# Patient Record
Sex: Male | Born: 1990 | Race: Black or African American | Hispanic: No | Marital: Single
Health system: Southern US, Community
[De-identification: ages and names within clinical notes are randomized; demographics above are authoritative.]

---
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
## 2015-02-03 ENCOUNTER — Other Ambulatory Visit (HOSPITAL_COMMUNITY): Payer: Self-pay

## 2015-02-03 ENCOUNTER — Encounter (HOSPITAL_COMMUNITY): Payer: Self-pay | Admitting: Emergency Medicine

## 2015-02-03 ENCOUNTER — Emergency Department (HOSPITAL_COMMUNITY): Payer: Self-pay

## 2015-02-03 ENCOUNTER — Inpatient Hospital Stay (HOSPITAL_COMMUNITY)
Admission: EM | Admit: 2015-02-03 | Discharge: 2015-02-03 | DRG: 948 | Discharge status: Against Medical Advice | Payer: Self-pay | Attending: Internal Medicine | Admitting: Internal Medicine

## 2015-02-03 DIAGNOSIS — J45909 Unspecified asthma, uncomplicated: Secondary | ICD-10-CM | POA: Diagnosis present

## 2015-02-03 DIAGNOSIS — R4182 Altered mental status, unspecified: Principal | ICD-10-CM | POA: Diagnosis present

## 2015-02-03 DIAGNOSIS — R404 Transient alteration of awareness: Secondary | ICD-10-CM

## 2015-02-03 DIAGNOSIS — J9690 Respiratory failure, unspecified, unspecified whether with hypoxia or hypercapnia: Secondary | ICD-10-CM

## 2015-02-03 DIAGNOSIS — E876 Hypokalemia: Secondary | ICD-10-CM | POA: Diagnosis present

## 2015-02-03 DIAGNOSIS — F10929 Alcohol use, unspecified with intoxication, unspecified: Secondary | ICD-10-CM | POA: Insufficient documentation

## 2015-02-03 DIAGNOSIS — F10129 Alcohol abuse with intoxication, unspecified: Secondary | ICD-10-CM | POA: Diagnosis present

## 2015-02-03 LAB — URINE MICROSCOPIC-ADD ON

## 2015-02-03 LAB — URINALYSIS, ROUTINE W REFLEX MICROSCOPIC
Bilirubin Urine: NEGATIVE
GLUCOSE, UA: NEGATIVE mg/dL
KETONES UR: NEGATIVE mg/dL
Leukocytes, UA: NEGATIVE
Nitrite: NEGATIVE
PH: 6 (ref 5.0–8.0)
Specific Gravity, Urine: 1.025 (ref 1.005–1.030)
Urobilinogen, UA: 0.2 mg/dL (ref 0.0–1.0)

## 2015-02-03 LAB — CBC
HCT: 39.7 % (ref 39.0–52.0)
Hemoglobin: 13.3 g/dL (ref 13.0–17.0)
MCH: 30.2 pg (ref 26.0–34.0)
MCHC: 33.5 g/dL (ref 30.0–36.0)
MCV: 90.2 fL (ref 78.0–100.0)
PLATELETS: 213 10*3/uL (ref 150–400)
RBC: 4.4 MIL/uL (ref 4.22–5.81)
RDW: 13.5 % (ref 11.5–15.5)
WBC: 7.1 10*3/uL (ref 4.0–10.5)

## 2015-02-03 LAB — BLOOD GAS, ARTERIAL
Acid-base deficit: 5.9 mmol/L — ABNORMAL HIGH (ref 0.0–2.0)
Bicarbonate: 18.3 mEq/L — ABNORMAL LOW (ref 20.0–24.0)
Drawn by: 382351
FIO2: 0.4 %
MECHVT: 500 mL
O2 Saturation: 99.1 %
PEEP: 5 cmH2O
PH ART: 7.38 (ref 7.350–7.450)
Patient temperature: 37
RATE: 14 resp/min
TCO2: 16.2 mmol/L (ref 0–100)
pCO2 arterial: 31.5 mmHg — ABNORMAL LOW (ref 35.0–45.0)
pO2, Arterial: 195 mmHg — ABNORMAL HIGH (ref 80.0–100.0)

## 2015-02-03 LAB — COMPREHENSIVE METABOLIC PANEL
ALBUMIN: 4.2 g/dL (ref 3.5–5.2)
ALK PHOS: 47 U/L (ref 39–117)
ALT: 14 U/L (ref 0–53)
AST: 21 U/L (ref 0–37)
Anion gap: 12 (ref 5–15)
BILIRUBIN TOTAL: 0.5 mg/dL (ref 0.3–1.2)
BUN: 13 mg/dL (ref 6–23)
CALCIUM: 8.4 mg/dL (ref 8.4–10.5)
CO2: 20 mmol/L (ref 19–32)
Chloride: 105 mmol/L (ref 96–112)
Creatinine, Ser: 1 mg/dL (ref 0.50–1.35)
GFR calc Af Amer: >90 mL/min (ref >90)
GFR calc non Af Amer: >90 mL/min (ref >90)
Glucose, Bld: 98 mg/dL (ref 70–99)
Potassium: 3.4 mmol/L — ABNORMAL LOW (ref 3.5–5.1)
SODIUM: 137 mmol/L (ref 135–145)
TOTAL PROTEIN: 7.5 g/dL (ref 6.0–8.3)

## 2015-02-03 LAB — CBC WITH DIFFERENTIAL/PLATELET
BASOS PCT: 1 % (ref 0–1)
Basophils Absolute: 0.1 10*3/uL (ref 0.0–0.1)
EOS ABS: 0.6 10*3/uL (ref 0.0–0.7)
Eosinophils Relative: 7 % — ABNORMAL HIGH (ref 0–5)
HCT: 41.8 % (ref 39.0–52.0)
HEMOGLOBIN: 14.2 g/dL (ref 13.0–17.0)
Lymphocytes Relative: 46 % (ref 12–46)
Lymphs Abs: 3.9 10*3/uL (ref 0.7–4.0)
MCH: 30.9 pg (ref 26.0–34.0)
MCHC: 34 g/dL (ref 30.0–36.0)
MCV: 91.1 fL (ref 78.0–100.0)
Monocytes Absolute: 0.6 10*3/uL (ref 0.1–1.0)
Monocytes Relative: 8 % (ref 3–12)
NEUTROS PCT: 38 % — AB (ref 43–77)
Neutro Abs: 3.3 10*3/uL (ref 1.7–7.7)
Platelets: 216 10*3/uL (ref 150–400)
RBC: 4.59 MIL/uL (ref 4.22–5.81)
RDW: 13.7 % (ref 11.5–15.5)
WBC: 8.5 10*3/uL (ref 4.0–10.5)

## 2015-02-03 LAB — RAPID URINE DRUG SCREEN, HOSP PERFORMED
AMPHETAMINES: NOT DETECTED
BARBITURATES: NOT DETECTED
Benzodiazepines: NOT DETECTED
Cocaine: NOT DETECTED
Opiates: NOT DETECTED
Tetrahydrocannabinol: POSITIVE — AB

## 2015-02-03 LAB — CK
CK TOTAL: 611 U/L — AB (ref 7–232)
CK TOTAL: 788 U/L — AB (ref 7–232)

## 2015-02-03 LAB — ETHANOL: Alcohol, Ethyl (B): 99 mg/dL — ABNORMAL HIGH (ref 0–9)

## 2015-02-03 LAB — MRSA PCR SCREENING: MRSA by PCR: NEGATIVE

## 2015-02-03 MED ORDER — ROCURONIUM BROMIDE 50 MG/5ML IV SOLN
INTRAVENOUS | Status: AC
  Filled 2015-02-03: qty 2

## 2015-02-03 MED ORDER — ENOXAPARIN SODIUM 40 MG/0.4ML ~~LOC~~ SOLN
40.0000 mg | SUBCUTANEOUS | Status: DC
  Administered 2015-02-03: 40 mg via SUBCUTANEOUS
  Filled 2015-02-03: qty 0.4

## 2015-02-03 MED ORDER — LIDOCAINE HCL (CARDIAC) 20 MG/ML IV SOLN
INTRAVENOUS | Status: AC
  Filled 2015-02-03: qty 5

## 2015-02-03 MED ORDER — PROPOFOL 10 MG/ML IV EMUL
INTRAVENOUS | Status: AC
  Filled 2015-02-03: qty 100

## 2015-02-03 MED ORDER — SUCCINYLCHOLINE CHLORIDE 20 MG/ML IJ SOLN
150.0000 mg | Freq: Once | INTRAMUSCULAR | Status: AC
  Administered 2015-02-03: 150 mg via INTRAVENOUS

## 2015-02-03 MED ORDER — FENTANYL CITRATE 0.05 MG/ML IJ SOLN
INTRAMUSCULAR | Status: AC
  Administered 2015-02-03: 50 ug
  Filled 2015-02-03: qty 2

## 2015-02-03 MED ORDER — CHLORHEXIDINE GLUCONATE 0.12 % MT SOLN
15.0000 mL | Freq: Two times a day (BID) | OROMUCOSAL | Status: DC
  Administered 2015-02-03: 15 mL via OROMUCOSAL
  Filled 2015-02-03: qty 15

## 2015-02-03 MED ORDER — ONDANSETRON HCL 4 MG PO TABS: 4.0000 mg | ORAL_TABLET | Freq: Four times a day (QID) | ORAL | Status: DC | PRN

## 2015-02-03 MED ORDER — ROCURONIUM BROMIDE 50 MG/5ML IV SOLN
50.0000 mg | Freq: Once | INTRAVENOUS | Status: AC
  Administered 2015-02-03: 50 mg via INTRAVENOUS

## 2015-02-03 MED ORDER — MIDAZOLAM HCL 2 MG/2ML IJ SOLN
INTRAMUSCULAR | Status: AC
  Filled 2015-02-03: qty 2

## 2015-02-03 MED ORDER — FENTANYL CITRATE 0.05 MG/ML IJ SOLN
INTRAMUSCULAR | Status: AC
  Filled 2015-02-03: qty 2

## 2015-02-03 MED ORDER — SUCCINYLCHOLINE CHLORIDE 20 MG/ML IJ SOLN
INTRAMUSCULAR | Status: AC
  Filled 2015-02-03: qty 1

## 2015-02-03 MED ORDER — PROPOFOL 10 MG/ML IV EMUL
5.0000 ug/kg/min | INTRAVENOUS | Status: DC
  Administered 2015-02-03: 5 ug/kg/min via INTRAVENOUS
  Administered 2015-02-03: 60 ug/kg/min via INTRAVENOUS
  Filled 2015-02-03: qty 200

## 2015-02-03 MED ORDER — ALBUTEROL SULFATE (2.5 MG/3ML) 0.083% IN NEBU
2.5000 mg | INHALATION_SOLUTION | Freq: Four times a day (QID) | RESPIRATORY_TRACT | Status: DC
  Administered 2015-02-03: 2.5 mg via RESPIRATORY_TRACT
  Filled 2015-02-03 (×2): qty 3

## 2015-02-03 MED ORDER — FENTANYL BOLUS VIA INFUSION
50.0000 ug | INTRAVENOUS | Status: DC | PRN
  Filled 2015-02-03: qty 50

## 2015-02-03 MED ORDER — ONDANSETRON HCL 4 MG/2ML IJ SOLN: 4.0000 mg | Freq: Four times a day (QID) | INTRAMUSCULAR | Status: DC | PRN

## 2015-02-03 MED ORDER — ETOMIDATE 2 MG/ML IV SOLN
INTRAVENOUS | Status: AC
  Filled 2015-02-03: qty 20

## 2015-02-03 MED ORDER — POTASSIUM CHLORIDE 10 MEQ/100ML IV SOLN
10.0000 meq | INTRAVENOUS | Status: AC
  Administered 2015-02-03 (×5): 10 meq via INTRAVENOUS
  Filled 2015-02-03 (×4): qty 100

## 2015-02-03 MED ORDER — ACETAMINOPHEN 650 MG RE SUPP: 650.0000 mg | Freq: Four times a day (QID) | RECTAL | Status: DC | PRN

## 2015-02-03 MED ORDER — MIDAZOLAM HCL 2 MG/2ML IJ SOLN
2.0000 mg | Freq: Once | INTRAMUSCULAR | Status: AC
  Administered 2015-02-03: 2 mg via INTRAVENOUS
  Filled 2015-02-03: qty 2

## 2015-02-03 MED ORDER — SODIUM CHLORIDE 0.9 % IV SOLN
INTRAVENOUS | Status: DC
  Administered 2015-02-03: 07:00:00 via INTRAVENOUS

## 2015-02-03 MED ORDER — OLANZAPINE 10 MG IM SOLR
5.0000 mg | Freq: Once | INTRAMUSCULAR | Status: DC
  Filled 2015-02-03: qty 10

## 2015-02-03 MED ORDER — FENTANYL CITRATE 0.05 MG/ML IJ SOLN
50.0000 ug | Freq: Once | INTRAMUSCULAR | Status: AC
  Administered 2015-02-03: 50 ug via INTRAVENOUS

## 2015-02-03 MED ORDER — ETOMIDATE 2 MG/ML IV SOLN
20.0000 mg | Freq: Once | INTRAVENOUS | Status: AC
  Administered 2015-02-03: 20 mg via INTRAVENOUS

## 2015-02-03 MED ORDER — SODIUM CHLORIDE 0.9 % IV SOLN
25.0000 ug/h | INTRAVENOUS | Status: DC
  Administered 2015-02-03: 50 ug/h via INTRAVENOUS
  Filled 2015-02-03: qty 50

## 2015-02-03 MED ORDER — ACETAMINOPHEN 325 MG PO TABS
650.0000 mg | ORAL_TABLET | Freq: Four times a day (QID) | ORAL | Status: DC | PRN
Start: 2015-02-03 — End: 2015-02-03

## 2015-02-03 MED ORDER — CETYLPYRIDINIUM CHLORIDE 0.05 % MT LIQD: 7.0000 mL | Freq: Four times a day (QID) | OROMUCOSAL | Status: DC

## 2015-02-03 MED ORDER — PANTOPRAZOLE SODIUM 40 MG IV SOLR
40.0000 mg | Freq: Every day | INTRAVENOUS | Status: DC
Start: 2015-02-03 — End: 2015-02-03
  Administered 2015-02-03: 40 mg via INTRAVENOUS
  Filled 2015-02-03: qty 40

## 2015-02-03 NOTE — Procedures (Signed)
Extubation Procedure Note  Patient Details:   Name: Ree ShayJevon E Baldus DOB: [birth year] MRN: [id number]   Airway Documentation:  Airway 7.5 mm (Active)  Secured at (cm) 25 cm 02/03/2015 12:03 PM  Measured From Teeth 02/03/2015 12:03 PM  Secured Location Right 02/03/2015 12:03 PM  Secured By [employer] 02/03/2015 12:03 PM  Cuff Pressure (cm H2O) 28 cm H2O 02/03/2015  8:26 AM  Site Condition Dry 02/03/2015 12:03 PM    MD order to extubate as soon as patient woke up.  Patient extubated by RT.  Patient extubated to 2 LPM [place], saturation 100%.  Will continue to monitor.   Evaluation  O2 sats: stable throughout Complications: No apparent complications Patient did tolerate procedure well. Bilateral Breath Sounds: Clear   Yes  Noreene FilbertLasley, Yanisa Goodgame Billingsley 02/03/2015, 2:08 PM

## 2015-02-03 NOTE — ED Provider Notes (Signed)
CSN: [id number]     Arrival date & time 02/03/15  0325 History   First MD Initiated Contact with Patient 02/03/15 [phone]     Chief Complaint  Patient presents with  . Altered Mental Status     (Consider location/radiation/quality/duration/timing/severity/associated sxs/prior Treatment) HPI Comments: Patient is a 24 year old male with no significant past medical history. He was brought by his brother for evaluation of mental status change. They were at a club earlier this evening. When they left and were driving home, the patient became suddenly confused and began shaking. He was then brought here for evaluation. Patient adds no additional history due to his mental status.  His brother does report that he was drinking alcohol this evening and he was celebrating his birthday. He is unsure of any drug use but states that his brother has used marijuana in the past.  Patient is a 24 y.o. male presenting with altered mental status. The history is provided by the patient.  Altered Mental Status Presenting symptoms: combativeness, confusion and disorientation   Severity:  Severe Most recent episode:  Today Episode history:  Single Timing:  Constant Progression:  Worsening Chronicity:  New Context: alcohol use     History reviewed. No pertinent past medical history. History reviewed. No pertinent past surgical history. History reviewed. No pertinent family history. History  Substance Use Topics  . Smoking status: Unknown If Ever Smoked  . Smokeless tobacco: Not on file  . Alcohol Use: Yes    Review of Systems  Psychiatric/Behavioral: Positive for confusion.  All other systems reviewed and are negative.     Allergies  Review of patient's allergies indicates no known allergies.  Home Medications   Prior to Admission medications   Not on File   BP 103/53 mmHg  Pulse 88  Resp 14  Wt 180 lb (81.647 kg)  SpO2 100% Physical Exam  Constitutional: He appears well-developed and  well-nourished.  Patient arrives here extremely agitated, writhing, with snoring respirations. He is vomiting.  HENT:  Head: Normocephalic and atraumatic.  Eyes: Pupils are equal, round, and reactive to light.  Neck: Normal range of motion. Neck supple.  Musculoskeletal: Normal range of motion. He exhibits no edema.  Neurological:  Neurologic exam is impossible due to the patient's combativeness and mental status. He does move all extremities.  Nursing note and vitals reviewed.   ED Course  Procedures (including critical care time) Labs Review Labs Reviewed  CBC WITH DIFFERENTIAL/PLATELET - Abnormal; Notable for the following:    Neutrophils Relative % 38 (*)    Eosinophils Relative 7 (*)    All other components within normal limits  BLOOD GAS, ARTERIAL - Abnormal; Notable for the following:    pCO2 arterial 31.5 (*)    pO2, Arterial 195.0 (*)    Bicarbonate 18.3 (*)    Acid-base deficit 5.9 (*)    All other components within normal limits  COMPREHENSIVE METABOLIC PANEL  ETHANOL  URINE RAPID DRUG SCREEN (HOSP PERFORMED)  URINALYSIS, ROUTINE W REFLEX MICROSCOPIC    Imaging Review No results found.    MDM   Final diagnoses:  None    Patient is a 24 year old male brought to the emergency department by friends for evaluation of mental status change. He he was drinking with friends at a club celebrating his birthday this evening. On his way home he apparently became nauseated and vomited. Shortly thereafter he became violent and incoherent. He was then brought here by his brother and friends for evaluation of this.  He was initially violent and uncooperative. These episodes rotated between this and episodes of decreased responsiveness with snoring respirations. When he was stimulated he would again become violent and require physical restraint. For this reason the decision was made to proceed with RSI for his protection and the protection of his care providers, and to facilitate  his workup.  RSI was performed using 20 mg of etomidate followed by 150 mg of succinylcholine. A 7.5 endotracheal tube was easily placed using a #3 McIntosh blade. Placement was confirmed with direct visualization, end-tidal CO2, and auscultation over the stomach and both lung fields.  Laboratory studies were obtained which were essentially unremarkable. His blood alcohol was 99 and urine tox screen revealed only marijuana. His presentation is consistent with ingestion of some sort of synthetic drug, whether it be ectasy, GHB, spice, molly, or some other substance. I've spoken with poison control who have advised to watch for rhabdomyolysis. I've also spoken with Dr. Sharl Ma from the hospitalist service who agrees to admit.  CRITICAL CARE Performed by: Geoffery Lyons Total critical care time: 70 minutes Critical care time was exclusive of separately billable procedures and treating other patients. Critical care was necessary to treat or prevent imminent or life-threatening deterioration. Critical care was time spent personally by me on the following activities: development of treatment plan with patient and/or surrogate as well as nursing, discussions with consultants, evaluation of patient's response to treatment, examination of patient, obtaining history from patient or surrogate, ordering and performing treatments and interventions, ordering and review of laboratory studies, ordering and review of radiographic studies, pulse oximetry and re-evaluation of patient's condition.     Geoffery Lyons, MD 02/03/15 [phone]

## 2015-02-03 NOTE — ED Notes (Signed)
Patient resting more comfortably after Propofol increased to 60 mcg/kg/min and Versed given.

## 2015-02-03 NOTE — Consult Note (Signed)
Consult requested by: [facility]  Consult requested for ventilator:  HPI: This is a 24 year old who was brought to the emergency room with altered mental status. The history is obtained from the medical record. He apparently had been at a nightclub with his brother and they were driving home when the patient became confused and began shaking. It's not clear if there was an actual seizure. He had been drinking alcohol and is not clear if he had any other drugs. His urine drug screen was positive for marijuana. He became agitated and uncooperative had to be restrained and eventually had to be intubated for airway protection. He is now sedated and intubated and on mechanical ventilation  History reviewed. No pertinent past medical history.   History reviewed. No pertinent family history.   History   Social History  . Marital Status: Single    Spouse Name: N/A  . Number of Children: N/A  . Years of Education: N/A   Social History Main Topics  . Smoking status: Unknown If Ever Smoked  . Smokeless tobacco: Not on file  . Alcohol Use: Yes  . Drug Use: Not on file  . Sexual Activity: Not on file   Other Topics Concern  . None   Social History Narrative  . None     ROS: Not obtainable    Objective: Vital signs in last 24 hours: Pulse Rate:  [53-114] 53 (03/11 0600) Resp:  [12-20] 14 (03/11 0600) BP: (96-212)/(49-116) 97/57 mmHg (03/11 0600) SpO2:  [99 %-100 %] 100 % (03/11 0600) FiO2 (%):  [40 %] 40 % (03/11 0346) Weight:  [81.647 kg (180 lb)] 81.647 kg (180 lb) (03/11 0347) Weight change:     Intake/Output from previous day:    PHYSICAL EXAM He is intubated and sedated. He is on mechanical ventilation. His pupils react nose and throat are clear mucous membranes are moist his neck is supple. His chest shows rhonchi his heart is regular without gallop. His abdomen is soft. Extremities showed no edema. He has multiple tattoos. Central nervous system examination not  really obtainable but He had been moving all 4 extremities.   Lab Results: Basic Metabolic Panel:  Recent Labs  60/45/40 0345  NA 137  K 3.4*  CL 105  CO2 20  GLUCOSE 98  BUN 13  CREATININE 1.00  CALCIUM 8.4   Liver Function Tests:  Recent Labs  02/03/15 0345  AST 21  ALT 14  ALKPHOS 47  BILITOT 0.5  PROT 7.5  ALBUMIN 4.2   No results for input(s): LIPASE, AMYLASE in the last 72 hours. No results for input(s): AMMONIA in the last 72 hours. CBC:  Recent Labs  02/03/15 0345  WBC 8.5  NEUTROABS 3.3  HGB 14.2  HCT 41.8  MCV 91.1  PLT 216   Cardiac Enzymes: No results for input(s): CKTOTAL, CKMB, CKMBINDEX, TROPONINI in the last 72 hours. BNP: No results for input(s): PROBNP in the last 72 hours. D-Dimer: No results for input(s): DDIMER in the last 72 hours. CBG: No results for input(s): GLUCAP in the last 72 hours. Hemoglobin A1C: No results for input(s): HGBA1C in the last 72 hours. Fasting Lipid Panel: No results for input(s): CHOL, HDL, LDLCALC, TRIG, CHOLHDL, LDLDIRECT in the last 72 hours. Thyroid Function Tests: No results for input(s): TSH, T4TOTAL, FREET4, T3FREE, THYROIDAB in the last 72 hours. Anemia Panel: No results for input(s): VITAMINB12, FOLATE, FERRITIN, TIBC, IRON, RETICCTPCT in the last 72 hours. Coagulation: No results for input(s): LABPROT, INR  in the last 72 hours. Urine Drug Screen: Drugs of Abuse     Component Value Date/Time   LABOPIA NONE DETECTED 02/03/2015 0405   COCAINSCRNUR NONE DETECTED 02/03/2015 0405   LABBENZ NONE DETECTED 02/03/2015 0405   AMPHETMU NONE DETECTED 02/03/2015 0405   THCU POSITIVE* 02/03/2015 0405   LABBARB NONE DETECTED 02/03/2015 0405    Alcohol Level:  Recent Labs  02/03/15 0345  ETH 99*   Urinalysis:  Recent Labs  02/03/15 0405  COLORURINE YELLOW  LABSPEC 1.025  PHURINE 6.0  GLUCOSEU NEGATIVE  HGBUR TRACE*  BILIRUBINUR NEGATIVE  KETONESUR NEGATIVE  PROTEINUR TRACE*  UROBILINOGEN  0.2  NITRITE NEGATIVE  LEUKOCYTESUR NEGATIVE   Misc. Labs:   ABGS:  Recent Labs  02/03/15 0347  PHART 7.380  PO2ART 195.0*  TCO2 16.2  HCO3 18.3*     MICROBIOLOGY: No results found for this or any previous visit (from the past 240 hour(s)).  Studies/Results: Ct Head Wo Contrast  02/03/2015   CLINICAL DATA:  Initial evaluation for acute altered mental status, unresponsive.  EXAM: CT HEAD WITHOUT CONTRAST  TECHNIQUE: Contiguous axial images were obtained from the base of the skull through the vertex without intravenous contrast.  COMPARISON:  None.  FINDINGS: There is no acute intracranial hemorrhage or infarct. No mass lesion or midline shift. Gray-white matter differentiation is well maintained. Ventricles are normal in size without evidence of hydrocephalus. CSF containing spaces are within normal limits. No extra-axial fluid collection.  The calvarium is intact.  Orbital soft tissues are within normal limits.  Partially visualized nasal cavities are opacified. There is scattered mucoperiosteal thickening within the ethmoidal air cells. Paranasal sinuses are otherwise clear. No mastoid effusion.  Scalp soft tissues are unremarkable.  IMPRESSION: Normal head CT with no acute intracranial abnormality identified.   Electronically Signed   By: Rise Mu M.D.   On: 02/03/2015 04:39   Dg Chest Portable 1 View  02/03/2015   CLINICAL DATA:  Altered level of consciousness. Endotracheal tube placement.  EXAM: PORTABLE CHEST - 1 VIEW  COMPARISON:  None.  FINDINGS: The endotracheal tube is 5.8 cm from the carina. Tip and side port of the enteric tube below the diaphragm in the stomach. Low lung volumes. Cardiomediastinal contours are normal. No consolidation, pleural effusion or pneumothorax. No osseous abnormalities.  IMPRESSION: Endotracheal and enteric tubes in place.  Clear lungs.   Electronically Signed   By: Rubye Oaks M.D.   On: 02/03/2015 04:44    Medications:  Prior to  Admission:  No prescriptions prior to admission   Scheduled: . lidocaine (cardiac) 100 mg/72ml       Continuous: . propofol 60 mcg/kg/min (02/03/15 0439)   PRN:  Assesment: He has altered mental status and was intubated mostly for airway protection. He does have a history of asthma. He is on propofol. He apparently has a history of asthma. Chest x-ray is clear. Principal Problem:   Altered mental state Active Problems:   Altered level of consciousness    Plan: Since he has a history of asthma would be reasonable to have him on nebulizer treatments because bronchospasm may cause more trouble with trying to get him off of the ventilator. I don't think it's quite clear why he has the altered mental status and I'm not sure if the drug screen would pick up other street drugs. It does not appear that he aspirated. Once his mental status has improved weaning can be attempted. I will be out of town starting about  noon today and will return on Monday, 02/06/2015    LOS: 0 days   Kyera Felan L 02/03/2015, 7:18 AM

## 2015-02-03 NOTE — H&P (Signed)
PCP:   No primary care provider on file.   Chief Complaint:  Altered mental status  HPI: 24 year old male with history of asthma who was brought to the ED by his brother for evaluation of AMS. Patient and his brother were at a club earlier this evening and then they were driving home patient suddenly became confused and began shaking. Patient was found to be in or mental status. Patient was drinking alcohol as per the brother, not sure whether patient used any drugs. In the ED patient became uncooperative and agitated and violent requiring physical restraint. Later patient was intubated. Urine toxicology screen showed marijuana. Poison control was contacted by Dr. Judd Lienelo and they recommend to watch for rhabdomyolysis.  Allergies:  No Known Allergies   History reviewed. No pertinent past medical history.  History reviewed. No pertinent past surgical history.  Prior to Admission medications   Not on File    Social History:  reports that he drinks alcohol. His tobacco and drug histories are not on file.   family history- noncontributory  All the positives are listed in [facility]  Review of Systems:  Unobtainable at this time   Physical Exam: Blood pressure 98/53, pulse 62, resp. rate 16, weight 81.647 kg (180 lb), SpO2 99 %. Constitutional:   Patient is a well-developed and well-nourished male who is currently intubated. Head: Normocephalic and atraumatic Mouth: Endotracheal tube in place Eyes: PERRL, EOMI, conjunctivae normal Neck: Supple, No Thyromegaly Cardiovascular: RRR, S1 normal, S2 normal Pulmonary/Chest: CTAB, no wheezes, rales, or rhonchi Abdominal: Soft. Non-tender, non-distended, bowel sounds are normal, no masses, organomegaly, or guarding present.  Neurological: Sedated Extremities : No Cyanosis, Clubbing or Edema  Labs on Admission:  Basic Metabolic Panel:  Recent Labs Lab 02/03/15 0345  NA 137  K 3.4*  CL 105  CO2 20  GLUCOSE 98  BUN 13  CREATININE  1.00  CALCIUM 8.4   Liver Function Tests:  Recent Labs Lab 02/03/15 0345  AST 21  ALT 14  ALKPHOS 47  BILITOT 0.5  PROT 7.5  ALBUMIN 4.2   No results for input(s): LIPASE, AMYLASE in the last 168 hours. No results for input(s): AMMONIA in the last 168 hours. CBC:  Recent Labs Lab 02/03/15 0345  WBC 8.5  NEUTROABS 3.3  HGB 14.2  HCT 41.8  MCV 91.1  PLT 216   Radiological Exams on Admission: Ct Head Wo Contrast  02/03/2015   CLINICAL DATA:  Initial evaluation for acute altered mental status, unresponsive.  EXAM: CT HEAD WITHOUT CONTRAST  TECHNIQUE: Contiguous axial images were obtained from the base of the skull through the vertex without intravenous contrast.  COMPARISON:  None.  FINDINGS: There is no acute intracranial hemorrhage or infarct. No mass lesion or midline shift. Gray-white matter differentiation is well maintained. Ventricles are normal in size without evidence of hydrocephalus. CSF containing spaces are within normal limits. No extra-axial fluid collection.  The calvarium is intact.  Orbital soft tissues are within normal limits.  Partially visualized nasal cavities are opacified. There is scattered mucoperiosteal thickening within the ethmoidal air cells. Paranasal sinuses are otherwise clear. No mastoid effusion.  Scalp soft tissues are unremarkable.  IMPRESSION: Normal head CT with no acute intracranial abnormality identified.   Electronically Signed   By: Rise MuBenjamin  McClintock M.D.   On: 02/03/2015 04:39     Assessment/Plan Principal Problem:   Altered mental state Active Problems:   Altered level of consciousness  Altered mental status Patient presenting with altered mental status,  likely from drug overdose. Urine drug screen only shows marijuana. Poison control was contacted by the ED physician and they recommended to check for rhabdomyolysis. Will obtain CK. Patient is currently intubated and sedated with propofol. We'll also obtain EKG. Will continue IV  fluids   Code status: Full code  Family discussion: Admission, patients condition and plan of care including tests being ordered have been discussed with the patient and *his mother at bedside* who indicate understanding and agree with the plan and Code Status.   Time Spent on Admission: 45 minutes  Kamera Dubas S [facility] Pager: [phone] 02/03/2015, 5:58 AM  If 7PM-7AM, please contact night-coverage  [email or web address]  Password [email or web address]

## 2015-02-03 NOTE — ED Notes (Signed)
Patient starting to move arms and legs.  Order received for Versed.

## 2015-02-03 NOTE — ED Notes (Signed)
Dr Lama in to assess for admission 

## 2015-02-03 NOTE — ED Notes (Signed)
Patient brought to ED by friends. On arrival to ED patient combative and obtunded with snoring respirations. Patient vomited on self prior to arrival. Friends report patient has been drinking tonight, denies drug use.

## 2015-02-03 NOTE — Progress Notes (Addendum)
Patient was admitted to the hospital earlier this morning by Dr. Sharl MaLama  Patient seen and examined.  He was admitted to the hospital with alteration in mental status. Patient is sedated, intubated at this time and cannot provide any history. Review of records indicate that he was at a club early last night. When he was returning home with his brother, he suddenly became confused and "began shaking". When he was brought to the emergency room patient was very agitated, combative and violent. He was very disoriented and confused. Alcohol level was noted to be mildly elevated. Urine drug screen positive for cannabis. Due to his severe disorientation and combativeness, patient required intubation for adequate sedation. He was seen by pulmonology today. Lungs appear clear without any wheezing. CT head was unremarkable. Urinalysis did not show any acute findings. It is possible the patient may have ingested some substance that is not detected on urine drug screen. We will continue with IV fluids and monitor clinical status. Will attempt wake up assessment in the morning.  Glenn Harris   Addendum 12:00:  Was called by staff to assess the patient for increasing agitation. Patient is on max dose of propofol and still very agitated requiring at least 3 staff members to help subdue him. Given additional sedation. Case was discussed with Dr. Molli KnockYacoub on [facility]. Recommendations were to discontinue sedation and extubate the patient. If the patient fails extubation, he recommended reintubating the patient and [facility]. Will alert anesthesia as well as security at the time of extubation.  Glenn Harris

## 2015-02-03 NOTE — Care Management Utilization Note (Signed)
UR completed 

## 2015-02-03 NOTE — Discharge Summary (Addendum)
Physician Discharge Summary  Glenn Harris WJX:[id number] DOB: 08/05/1991 DOA: 02/03/2015  PCP: No primary care provider on file.  Admit date: 02/03/2015 Discharge date: 02/03/2015  Time spent: 60 minutes  1. PATIENT LEFT THE HOSPITAL AGAINST MEDICAL ADVICE  Discharge Diagnoses:  Principal Problem:   Altered mental state Active Problems:   Altered level of consciousness   Alcohol intoxication   Hypokalemia     Filed Weights   02/03/15 0339 02/03/15 0347  Weight: 81.647 kg (180 lb) 81.647 kg (180 lb)    History of present illness:  This patient was admitted to the hospital after a presented with altered mental status. Per ER notes, he was noted to be lethargic as well as very combative and aggressive when aroused. Decision was made by ER to intubate him for his protection, and the protection of his care providers, and to facilitate his workup.  Hospital Course:  Patient was admitted to the ICU. He remained intubated and required large amounts of sedation. Case was discussed with critical care who recommended to turn off sedation and extubate the patient. Patient's lab work on admission was unremarkable. CT scan of the head was also unrevealing. Urinalysis did not show any significant findings and urine drug screen was positive for cannabis. His alcohol level was mildly elevated at 99. It is possible that he could've taken some street drugs, with or without his knowledge, that was not showing up on the drug screen. In any case, patient was extubated and became extremely aggressive and combative. He was insistent on leaving the hospital and that there was "nothing wrong with him". The patient was very aggressive, he did not appear to be disoriented. He was aware that he was in the hospital, he knew the day of the week, he was aware that he was out partying the night before and that it was his birthday the day before. After discussing with family members, they reported that patient does  not like doctors/hospital or the police. When asked if his current behavior is out of character for him, they responded no and could imagine him behaving this way. Patient was very insistent on leaving the hospital AGAINST MEDICAL ADVICE. Due to his aggressiveness, security was called for staff safety who in turn notified the police. When police arrived, one of the officers recognized the patient and reported that he had witnessed similar behavior with the patient in the past. After working with the patient, it was discovered that there was a previous warrant out for the patient's arrest. The patient signed out AGAINST MEDICAL ADVICE and was taken into custody by police.  No Known Allergies    The results of significant diagnostics from this hospitalization (including imaging, microbiology, ancillary and laboratory) are listed below for reference.    Significant Diagnostic Studies: Ct Head Wo Contrast  02/03/2015   CLINICAL DATA:  Initial evaluation for acute altered mental status, unresponsive.  EXAM: CT HEAD WITHOUT CONTRAST  TECHNIQUE: Contiguous axial images were obtained from the base of the skull through the vertex without intravenous contrast.  COMPARISON:  None.  FINDINGS: There is no acute intracranial hemorrhage or infarct. No mass lesion or midline shift. Gray-white matter differentiation is well maintained. Ventricles are normal in size without evidence of hydrocephalus. CSF containing spaces are within normal limits. No extra-axial fluid collection.  The calvarium is intact.  Orbital soft tissues are within normal limits.  Partially visualized nasal cavities are opacified. There is scattered mucoperiosteal thickening within the ethmoidal air cells. Paranasal  sinuses are otherwise clear. No mastoid effusion.  Scalp soft tissues are unremarkable.  IMPRESSION: Normal head CT with no acute intracranial abnormality identified.   Electronically Signed   By: Rise MuBenjamin  McClintock M.D.   On: 02/03/2015  04:39   Dg Chest Portable 1 View  02/03/2015   CLINICAL DATA:  Altered level of consciousness. Endotracheal tube placement.  EXAM: PORTABLE CHEST - 1 VIEW  COMPARISON:  None.  FINDINGS: The endotracheal tube is 5.8 cm from the carina. Tip and side port of the enteric tube below the diaphragm in the stomach. Low lung volumes. Cardiomediastinal contours are normal. No consolidation, pleural effusion or pneumothorax. No osseous abnormalities.  IMPRESSION: Endotracheal and enteric tubes in place.  Clear lungs.   Electronically Signed   By: Rubye OaksMelanie  Ehinger M.D.   On: 02/03/2015 04:44    Microbiology: No results found for this or any previous visit (from the past 240 hour(s)).   Labs: Basic Metabolic Panel:  Recent Labs Lab 02/03/15 0345  NA 137  K 3.4*  CL 105  CO2 20  GLUCOSE 98  BUN 13  CREATININE 1.00  CALCIUM 8.4   Liver Function Tests:  Recent Labs Lab 02/03/15 0345  AST 21  ALT 14  ALKPHOS 47  BILITOT 0.5  PROT 7.5  ALBUMIN 4.2   No results for input(s): LIPASE, AMYLASE in the last 168 hours. No results for input(s): AMMONIA in the last 168 hours. CBC:  Recent Labs Lab 02/03/15 0345 02/03/15 0825  WBC 8.5 7.1  NEUTROABS 3.3  --   HGB 14.2 13.3  HCT 41.8 39.7  MCV 91.1 90.2  PLT 216 213   Cardiac Enzymes:  Recent Labs Lab 02/03/15 0825 02/03/15 1306  CKTOTAL 611* 788*   BNP: BNP (last 3 results) No results for input(s): BNP in the last 8760 hours.  ProBNP (last 3 results) No results for input(s): PROBNP in the last 8760 hours.  CBG: No results for input(s): GLUCAP in the last 168 hours.     Signed:  Oluwatoyin Banales  [facility] 02/03/2015, 8:14 PM

## 2015-02-03 NOTE — ED Notes (Signed)
Mother at bedside.  Clothes given to mother.  Dr. Judd LieneLo in to speak with mother.

## 2015-10-18 IMAGING — CT CT HEAD W/O CM
1 series · 16 of 30 positions shown, 20 images · non-contrast
Comparison: None.

CLINICAL DATA: Initial evaluation for acute altered mental status,
unresponsive.

EXAM:
CT HEAD WITHOUT CONTRAST
TECHNIQUE: Contiguous axial images were obtained from the base of the skull
through the vertex without intravenous contrast.

[Series 2: headtrauma 4.8 h37s · axial · 0.46mm/px · z∈[+96,+227]mm · 16 of 30 slices shown, 20 images]
[im 2/30  brain]
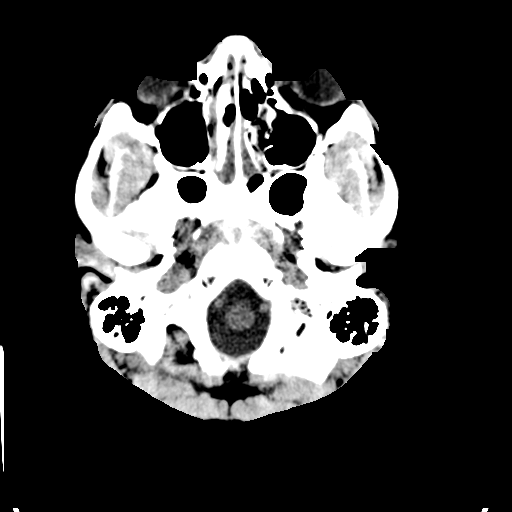
[im 2/30  bone]
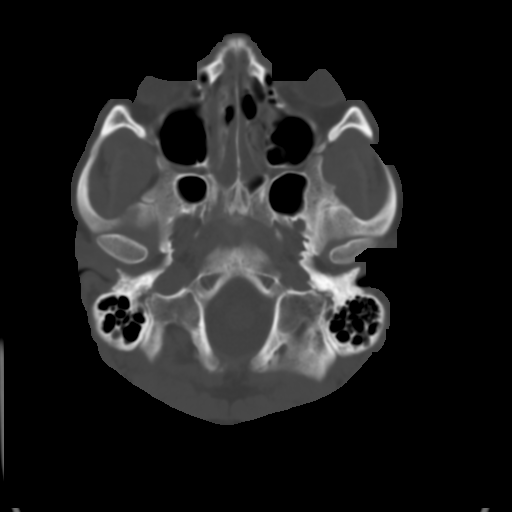
[im 4/30  brain]
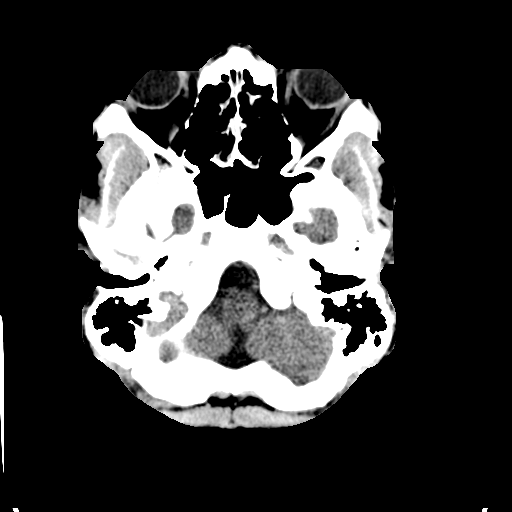
[im 6/30  brain]
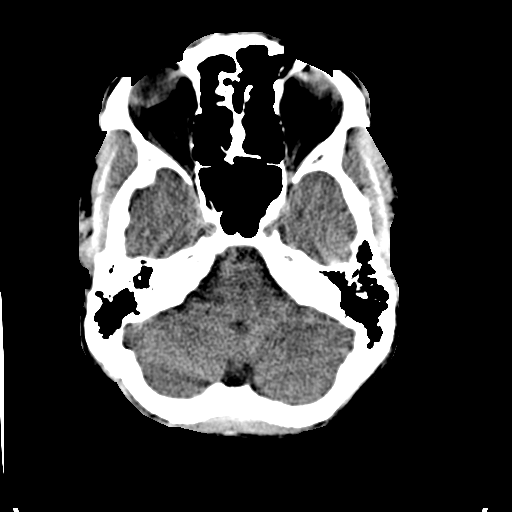
[im 8/30  brain]
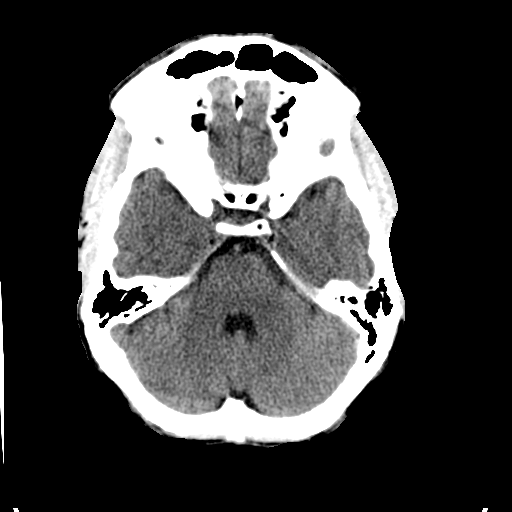
[im 9/30  brain]
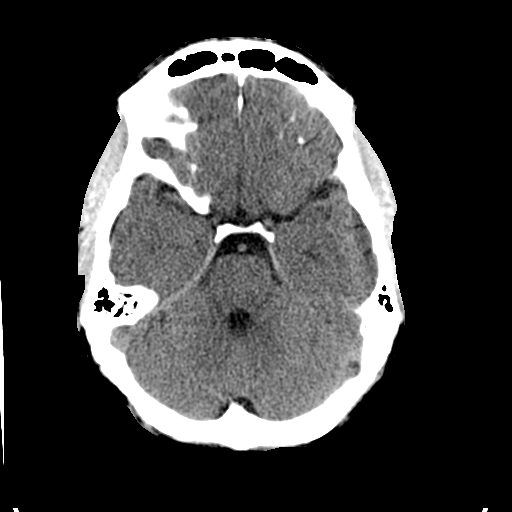
[im 9/30  bone]
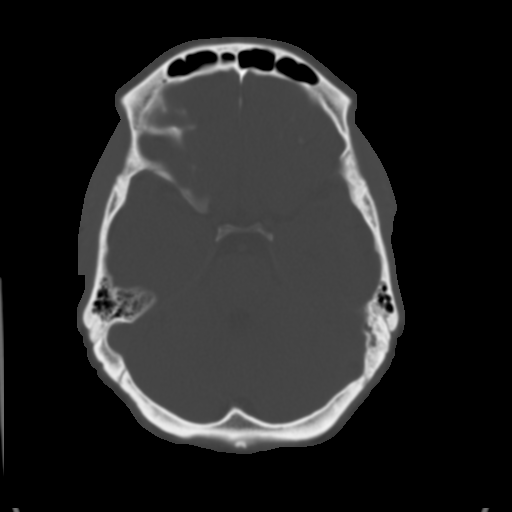
[im 11/30  brain]
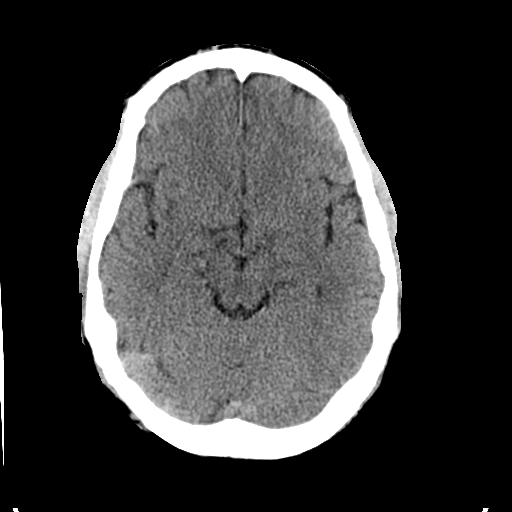
[im 13/30  brain]
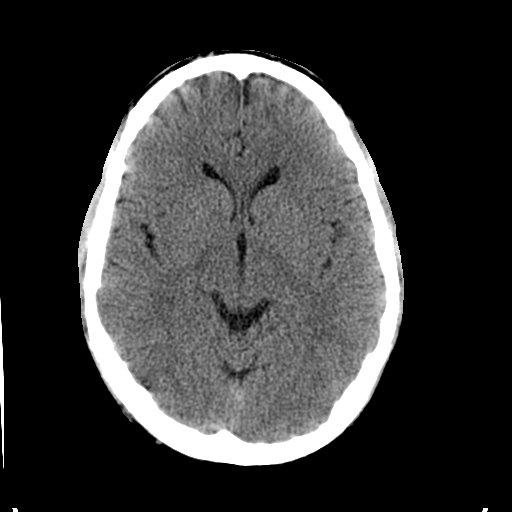
[im 15/30  brain]
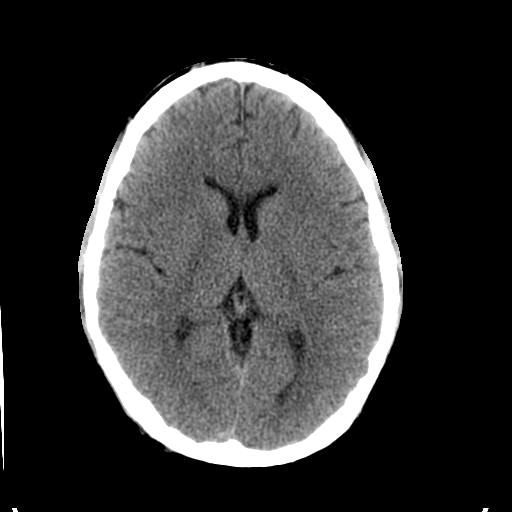
[im 16/30  brain]
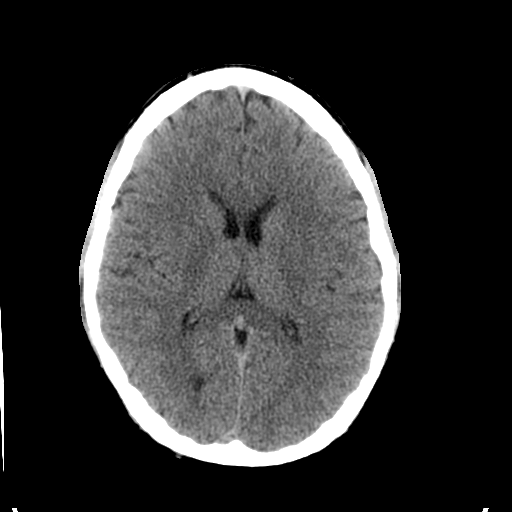
[im 16/30  bone]
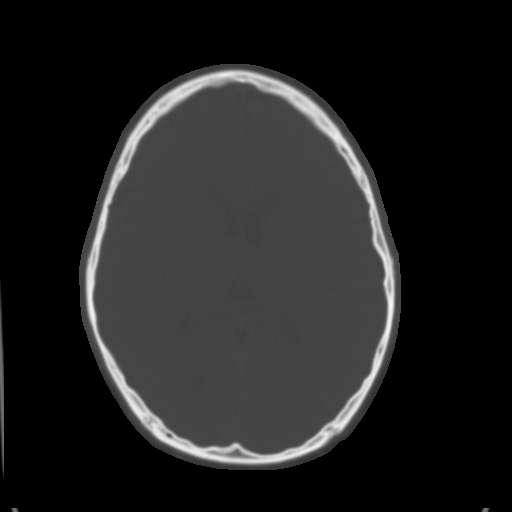
[im 18/30  brain]
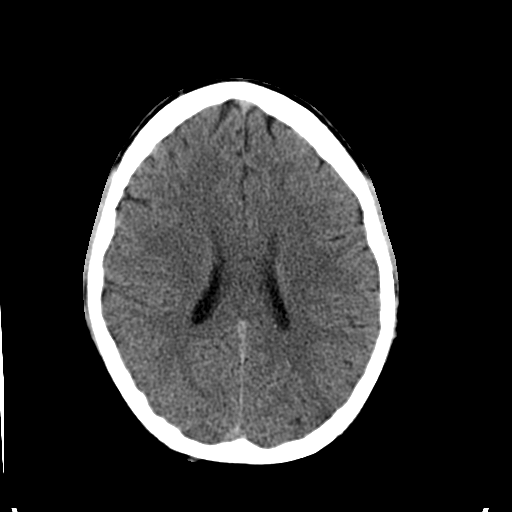
[im 20/30  brain]
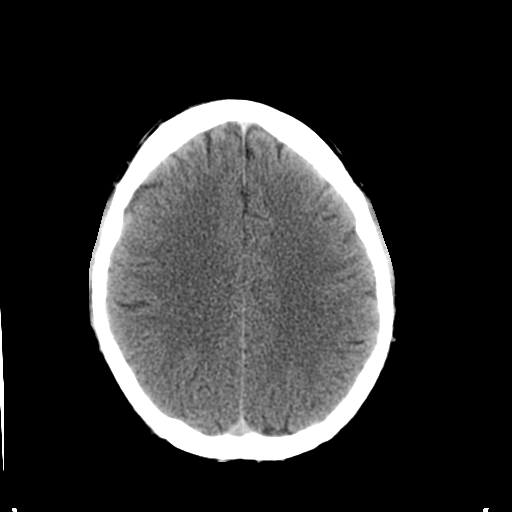
[im 22/30  brain]
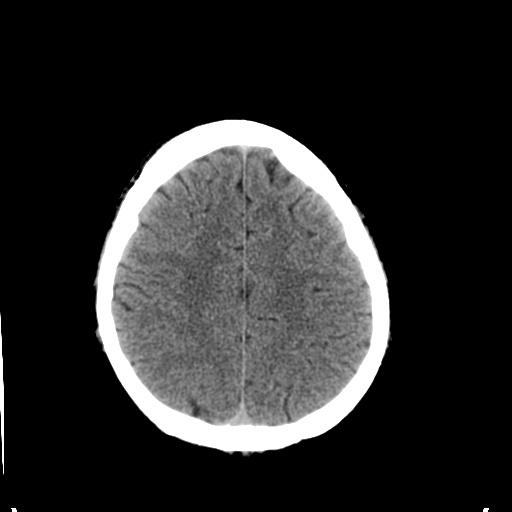
[im 23/30  brain]
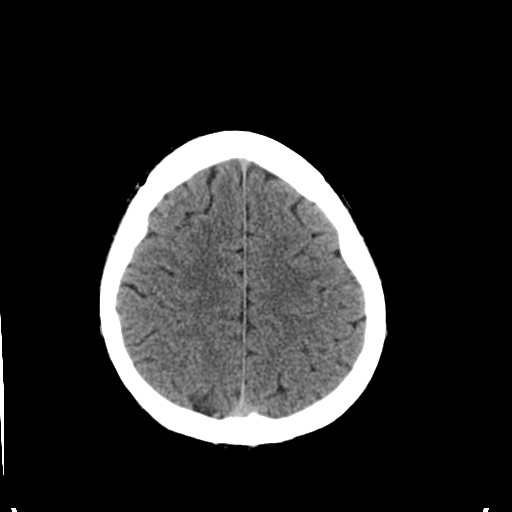
[im 23/30  bone]
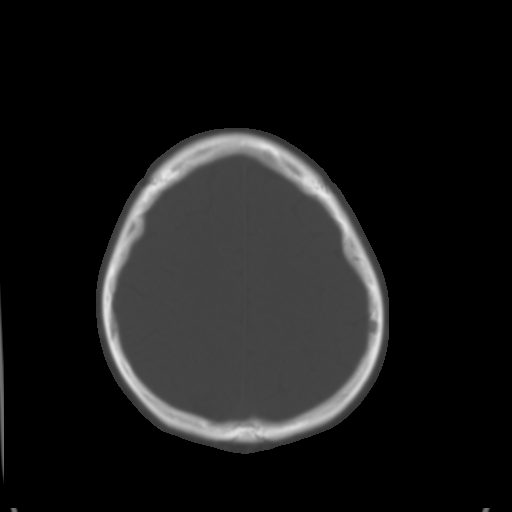
[im 25/30  brain]
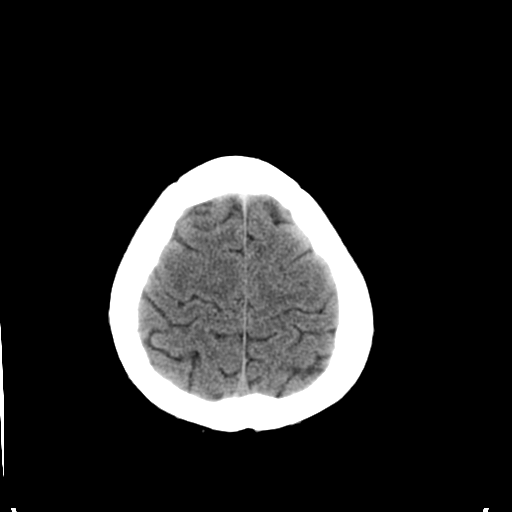
[im 27/30  brain]
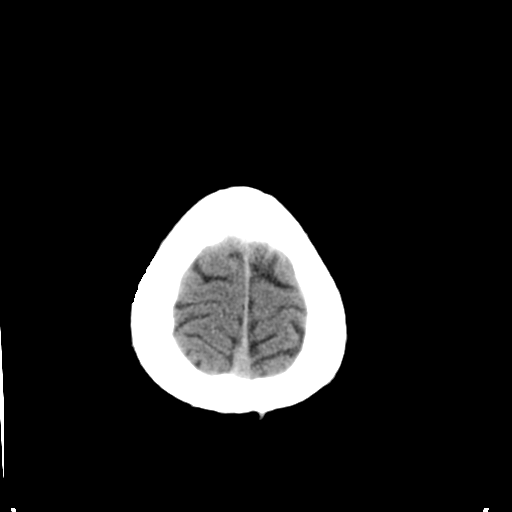
[im 29/30  brain]
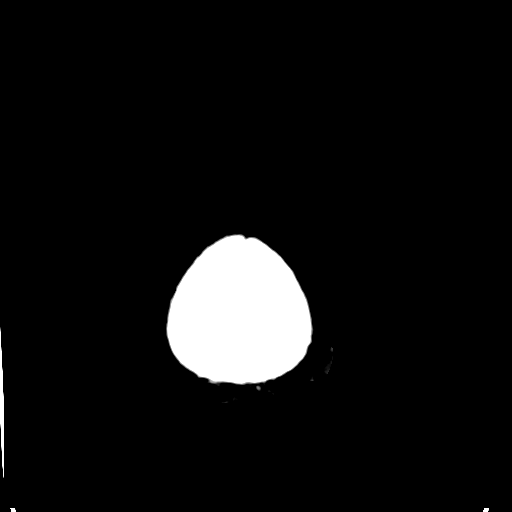

[16 of 30 positions shown; findings below may reference images not displayed]

FINDINGS: There is no acute intracranial hemorrhage or infarct. No mass lesion
or midline shift. Gray-white matter differentiation is well
maintained. Ventricles are normal in size without evidence of
hydrocephalus. CSF containing spaces are within normal limits. No
extra-axial fluid collection.

The calvarium is intact.

Orbital soft tissues are within normal limits.

Partially visualized nasal cavities are opacified. There is
scattered mucoperiosteal thickening within the ethmoidal air cells.
Paranasal sinuses are otherwise clear. No mastoid effusion.

Scalp soft tissues are unremarkable.
IMPRESSION: Normal head CT with no acute intracranial abnormality identified.
